# Patient Record
Sex: Female | Born: 2000
Health system: Southern US, Community
[De-identification: ages and names within clinical notes are randomized; demographics above are authoritative.]

## PROBLEM LIST (undated history)

## (undated) DIAGNOSIS — R9431 Abnormal electrocardiogram [ECG] [EKG]: Secondary | ICD-10-CM

## (undated) DIAGNOSIS — N920 Excessive and frequent menstruation with regular cycle: Secondary | ICD-10-CM

## (undated) DIAGNOSIS — I493 Ventricular premature depolarization: Secondary | ICD-10-CM

## (undated) DIAGNOSIS — H547 Unspecified visual loss: Secondary | ICD-10-CM

## (undated) HISTORY — DX: Excessive and frequent menstruation with regular cycle: N92.0

## (undated) HISTORY — DX: Abnormal electrocardiogram (ECG) (EKG): R94.31

## (undated) HISTORY — DX: Ventricular premature depolarization: I49.3

## (undated) HISTORY — DX: Unspecified visual loss: H54.7

---
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
## 2021-01-01 DIAGNOSIS — Z23 Encounter for immunization: Secondary | ICD-10-CM | POA: Diagnosis not present

## 2021-08-09 ENCOUNTER — Emergency Department
Admission: EM | Admit: 2021-08-09 | Discharge: 2021-08-09 | Disposition: A | Discharge status: Home / Self Care | Payer: BC Managed Care – PPO | Attending: Emergency Medicine | Admitting: Emergency Medicine

## 2021-08-09 ENCOUNTER — Emergency Department: Payer: BC Managed Care – PPO

## 2021-08-09 ENCOUNTER — Other Ambulatory Visit: Payer: Self-pay

## 2021-08-09 ENCOUNTER — Ambulatory Visit
Admission: EM | Admit: 2021-08-09 | Discharge: 2021-08-09 | Disposition: A | Discharge status: Home / Self Care | Payer: BC Managed Care – PPO | Attending: Emergency Medicine | Admitting: Emergency Medicine

## 2021-08-09 DIAGNOSIS — R112 Nausea with vomiting, unspecified: Secondary | ICD-10-CM | POA: Diagnosis not present

## 2021-08-09 DIAGNOSIS — R1010 Upper abdominal pain, unspecified: Secondary | ICD-10-CM | POA: Diagnosis not present

## 2021-08-09 DIAGNOSIS — R1084 Generalized abdominal pain: Secondary | ICD-10-CM | POA: Diagnosis not present

## 2021-08-09 DIAGNOSIS — R1013 Epigastric pain: Secondary | ICD-10-CM | POA: Diagnosis not present

## 2021-08-09 LAB — CBC
HCT: 39.1 % (ref 36.0–46.0)
Hemoglobin: 12.5 g/dL (ref 12.0–15.0)
MCH: 23.9 pg — ABNORMAL LOW (ref 26.0–34.0)
MCHC: 32 g/dL (ref 30.0–36.0)
MCV: 74.6 fL — ABNORMAL LOW (ref 80.0–100.0)
Platelets: 298 10*3/uL (ref 150–400)
RBC: 5.24 MIL/uL — ABNORMAL HIGH (ref 3.87–5.11)
RDW: 14.2 % (ref 11.5–15.5)
WBC: 7.3 10*3/uL (ref 4.0–10.5)
nRBC: 0 % (ref 0.0–0.2)

## 2021-08-09 LAB — COMPREHENSIVE METABOLIC PANEL
ALT: 15 U/L (ref 0–44)
AST: 17 U/L (ref 15–41)
Albumin: 4.5 g/dL (ref 3.5–5.0)
Alkaline Phosphatase: 62 U/L (ref 38–126)
Anion gap: 7 (ref 5–15)
BUN: 10 mg/dL (ref 6–20)
CO2: 26 mmol/L (ref 22–32)
Calcium: 9.8 mg/dL (ref 8.9–10.3)
Chloride: 105 mmol/L (ref 98–111)
Creatinine, Ser: 0.67 mg/dL (ref 0.44–1.00)
GFR, Estimated: >60 mL/min (ref >60)
Glucose, Bld: 104 mg/dL — ABNORMAL HIGH (ref 70–99)
Potassium: 3.4 mmol/L — ABNORMAL LOW (ref 3.5–5.1)
Sodium: 138 mmol/L (ref 135–145)
Total Bilirubin: 0.5 mg/dL (ref 0.3–1.2)
Total Protein: 8.1 g/dL (ref 6.5–8.1)

## 2021-08-09 LAB — POCT URINALYSIS DIP (MANUAL ENTRY)
Bilirubin, UA: NEGATIVE
Blood, UA: NEGATIVE
Glucose, UA: NEGATIVE mg/dL
Ketones, POC UA: NEGATIVE mg/dL
Leukocytes, UA: NEGATIVE
Nitrite, UA: NEGATIVE
Protein Ur, POC: NEGATIVE mg/dL
Spec Grav, UA: 1.015 (ref 1.010–1.025)
Urobilinogen, UA: 0.2 E.U./dL
pH, UA: 6 (ref 5.0–8.0)

## 2021-08-09 LAB — LIPASE, BLOOD: Lipase: 23 U/L (ref 11–51)

## 2021-08-09 LAB — POCT URINE PREGNANCY: Preg Test, Ur: NEGATIVE

## 2021-08-09 MED ORDER — ONDANSETRON 4 MG PO TBDP: 4.0000 mg | ORAL_TABLET | Freq: Three times a day (TID) | ORAL | 0 refills | Status: DC | PRN

## 2021-08-09 MED ORDER — SUCRALFATE 1 G PO TABS: 1.0000 g | ORAL_TABLET | Freq: Three times a day (TID) | ORAL | 0 refills | Status: AC

## 2021-08-09 MED ORDER — ONDANSETRON 4 MG PO TBDP
4.0000 mg | ORAL_TABLET | Freq: Once | ORAL | Status: AC | PRN
  Administered 2021-08-09: 4 mg via ORAL
  Filled 2021-08-09: qty 1

## 2021-08-09 MED ORDER — LIDOCAINE VISCOUS HCL 2 % MT SOLN
15.0000 mL | Freq: Once | OROMUCOSAL | Status: AC
  Administered 2021-08-09: 15 mL via ORAL
  Filled 2021-08-09: qty 15

## 2021-08-09 MED ORDER — PANTOPRAZOLE SODIUM 40 MG PO TBEC
40.0000 mg | DELAYED_RELEASE_TABLET | Freq: Once | ORAL | Status: AC
  Administered 2021-08-09: 40 mg via ORAL
  Filled 2021-08-09: qty 1

## 2021-08-09 MED ORDER — ONDANSETRON 4 MG PO TBDP
4.0000 mg | ORAL_TABLET | Freq: Once | ORAL | Status: AC
  Administered 2021-08-09: 4 mg via ORAL
  Filled 2021-08-09: qty 1

## 2021-08-09 MED ORDER — ONDANSETRON 4 MG PO TBDP: 4.0000 mg | ORAL_TABLET | Freq: Three times a day (TID) | ORAL | 0 refills | Status: AC | PRN

## 2021-08-09 MED ORDER — OMEPRAZOLE 20 MG PO CPDR: 20.0000 mg | DELAYED_RELEASE_CAPSULE | Freq: Every day | ORAL | 0 refills | Status: AC

## 2021-08-09 MED ORDER — ALUM & MAG HYDROXIDE-SIMETH 200-200-20 MG/5ML PO SUSP
30.0000 mL | Freq: Once | ORAL | Status: AC
  Administered 2021-08-09: 30 mL via ORAL
  Filled 2021-08-09: qty 30

## 2021-08-09 NOTE — ED Provider Notes (Signed)
[facility] Provider Note    Event Date/Time   First MD Initiated Contact with Patient 08/09/21 2126     (approximate)   History   Abdominal Pain   HPI  Yolanda Watkins is a 21 y.o. female here with abdominal pain.  The patient states that for the last several days, she has had fairly consistent, epigastric, abdominal pain.  She had associated nausea and intermittent vomiting.  She states that the pain is actually better when she eats, she was she has been eating almost nonstop.  She does intermittently vomit but has been able to hold down most meals.  Pain is aching, gnawing.  She states she has a family history of ulcers due to eating "a lot of spicy foods."  She has never had an ulcer herself.  No changes in her bowel habits.  No hematemesis.  No fevers or chills.  No recent travel.     Physical Exam   Triage Vital Signs: ED Triage Vitals  Enc Vitals Group     BP 08/09/21 1718 130/71     Pulse Rate 08/09/21 1718 64     Resp 08/09/21 1718 16     Temp 08/09/21 1718 98.4 F (36.9 C)     Temp Source 08/09/21 1718 Oral     SpO2 08/09/21 1718 100 %     Weight --      Height 08/09/21 1727 5\' 7"  (1.702 m)     Head Circumference --      Peak Flow --      Pain Score 08/09/21 1727 6     Pain Loc --      Pain Edu? --      Excl. in GC? --     Most recent vital signs: Vitals:   08/09/21 2138 08/09/21 2317  BP: 126/84 129/72  Pulse: 73 66  Resp: 18 18  Temp: 98.4 F (36.9 C)   SpO2: 100% 98%     General: Awake, no distress.  CV:  Good peripheral perfusion.  Resp:  Normal effort.  Abd:  No distention.  Minimal epigastric tenderness.  No right upper quadrant tenderness.  Negative Murphy's. Other:  Moist mucous membranes.   ED Results / Procedures / Treatments   Labs (all labs ordered are listed, but only abnormal results are displayed) Labs Reviewed  COMPREHENSIVE METABOLIC PANEL - Abnormal; Notable for the following components:      Result  Value   Potassium 3.4 (*)    Glucose, Bld 104 (*)    All other components within normal limits  CBC - Abnormal; Notable for the following components:   RBC 5.24 (*)    MCV 74.6 (*)    MCH 23.9 (*)    All other components within normal limits  LIPASE, BLOOD  URINALYSIS, ROUTINE W REFLEX MICROSCOPIC  POC URINE PREG, ED     EKG    RADIOLOGY Ultrasound: Common bile duct at the upper limit of normal, subtle increased echogenicity of the portal triads   I also independently reviewed and agree with radiologist interpretations.   PROCEDURES:  Critical Care performed: No     MEDICATIONS ORDERED IN ED: Medications  ondansetron (ZOFRAN-ODT) disintegrating tablet 4 mg (4 mg Oral Given 08/09/21 1730)  ondansetron (ZOFRAN-ODT) disintegrating tablet 4 mg (4 mg Oral Given 08/09/21 2214)  alum & mag hydroxide-simeth (MAALOX/MYLANTA) 200-200-20 MG/5ML suspension 30 mL (30 mLs Oral Given 08/09/21 2214)    And  lidocaine (XYLOCAINE) 2 % viscous mouth  solution 15 mL (15 mLs Oral Given 08/09/21 2214)  pantoprazole (PROTONIX) EC tablet 40 mg (40 mg Oral Given 08/09/21 2214)     IMPRESSION / MDM / ASSESSMENT AND PLAN / ED COURSE  I reviewed the triage vital signs and the nursing notes.                               The patient is on the cardiac monitor to evaluate for evidence of arrhythmia and/or significant heart rate changes.   Ddx:  Differential includes the following, with pertinent life- or limb-threatening emergencies considered:  Peptic ulcer disease with consideration of duodenal ulcer, peptic ulcer, duodenitis, less likely cholecystitis, hepatitis, pancreatitis  Patient's presentation is most consistent with acute illness / injury with system symptoms.  MDM:  21 year old well-appearing female here with intermittent abdominal pain, which actually improved with eating.  On exam, she is overall well-appearing and in no distress.  Lab work shows no leukocytosis or anemia.  CMP is  very reassuring with normal renal function, LFTs, and normal lipase.  Bilirubin is normal.  Ultrasound obtained, shows common bile duct at the upper limit of normal.  Also slightly increased echogenicity of the portal triads.  However, LFTs are completely normal, she has no right upper quadrant tenderness, exam is not concerning for cholangitis, choledocholithiasis, cholecystitis, or hepatitis.  Patient feels better with antacid treatment.  Treat for possible duodenal ulcers, no evidence of active bleeding.  Will advise outpatient follow-up and possible GI follow-up.  Avoid NSAIDs.   MEDICATIONS GIVEN IN ED: Medications  ondansetron (ZOFRAN-ODT) disintegrating tablet 4 mg (4 mg Oral Given 08/09/21 1730)  ondansetron (ZOFRAN-ODT) disintegrating tablet 4 mg (4 mg Oral Given 08/09/21 2214)  alum & mag hydroxide-simeth (MAALOX/MYLANTA) 200-200-20 MG/5ML suspension 30 mL (30 mLs Oral Given 08/09/21 2214)    And  lidocaine (XYLOCAINE) 2 % viscous mouth solution 15 mL (15 mLs Oral Given 08/09/21 2214)  pantoprazole (PROTONIX) EC tablet 40 mg (40 mg Oral Given 08/09/21 2214)     Consults:     EMR reviewed  Previous urgent care visits, PCP visits     FINAL CLINICAL IMPRESSION(S) / ED DIAGNOSES   Final diagnoses:  Generalized abdominal pain     Rx / DC Orders   ED Discharge Orders          Ordered    omeprazole (PRILOSEC) 20 MG capsule  Daily        08/09/21 2302    ondansetron (ZOFRAN-ODT) 4 MG disintegrating tablet  Every 8 hours PRN        08/09/21 2302    sucralfate (CARAFATE) 1 g tablet  3 times daily with meals & bedtime        08/09/21 2302             Note:  This document was prepared using Dragon voice recognition software and may include unintentional dictation errors.   Shaune Pollack, MD 08/09/21 [phone]

## 2021-08-09 NOTE — ED Provider Triage Note (Signed)
Emergency Medicine Provider Triage Evaluation Note  Yolanda Watkins , a 21 y.o. female  was evaluated in triage.  Pt complains of upper abdominal pain with associated vomiting for the last 5 days. She denies bowel changes or fevers. She was evaluated at [facility] and referred to the ED.  Review of Systems  Positive: Epigastric pain, NV Negative: FCS  Physical Exam  BP 130/71 (BP Location: Left Arm)   Pulse 64   Temp 98.4 F (36.9 C) (Oral)   Resp 16   Ht 5\' 7"  (1.702 m)   LMP 08/02/2021   SpO2 100%   BMI 27.41 kg/m  Gen:   Awake, no distress  NAD Resp:  Normal effort CTA MSK:   Moves extremities without difficulty  ABD:  Soft, nontender. No CVA tenderness  Medical Decision Making  Medically screening exam initiated at 5:30 PM.  Appropriate orders placed.  Yolanda Watkins was informed that the remainder of the evaluation will be completed by another provider, this initial triage assessment does not replace that evaluation, and the importance of remaining in the ED until their evaluation is complete.  Patient to the ED for evaluation of epigastric domino pain with associated nausea vomiting for the last 5 days.  Patient presents in no acute distress from local urgent care.   Yolanda Lex, PA-C 08/09/21 1739

## 2021-08-09 NOTE — ED Provider Notes (Signed)
Renaldo Fiddler    CSN: [id number] Arrival date & time: 08/09/21  1523      History   Chief Complaint Chief Complaint  Patient presents with   Abdominal Pain   Emesis   Gastroesophageal Reflux    HPI Yolanda Watkins is a 21 y.o. female.  Accompanied by her father, patient presents with upper abdominal pain, nausea, and vomiting x5 days.  The pain improves after she eats but then returns.  No known aggravating factors.  1 episode of emesis early this morning; She has been able to eat and drink since then today without vomiting.  Last bowel movement yesterday no fever, chills, diarrhea, constipation, blood in stool, dysuria, hematuria, vaginal discharge, pelvic pain, or other symptoms.  LMP: 08/02/2021.    The history is provided by the patient and medical records.    Past Medical History:  Diagnosis Date   Abnormal EKG    Decreased vision    Menorrhagia    PVC (premature ventricular contraction)     Patient Active Problem List   Diagnosis Date Noted   Heart murmur 02/18/2020   PVC's (premature ventricular contractions) 12/24/2019   Right otitis media 04/05/2013    History reviewed. No pertinent surgical history.  OB History   No obstetric history on file.      Home Medications    Prior to Admission medications   Medication Sig Start Date End Date Taking? Authorizing Provider  ondansetron (ZOFRAN-ODT) 4 MG disintegrating tablet Take 1 tablet (4 mg total) by mouth every 8 (eight) hours as needed for nausea or vomiting. 08/09/21  Yes Mickie Bail, NP    Family History Family History  Problem Relation Age of Onset   Healthy Mother    Healthy Father     Social History Social History   Tobacco Use   Smoking status: Never   Smokeless tobacco: Never     Allergies   Patient has no known allergies.   Review of Systems Review of Systems  Constitutional:  Negative for chills and fever.  Respiratory:  Negative for cough and shortness of breath.    Cardiovascular:  Negative for chest pain and palpitations.  Gastrointestinal:  Positive for abdominal pain, nausea and vomiting. Negative for blood in stool, constipation and diarrhea.  Genitourinary:  Negative for dysuria, flank pain, hematuria, pelvic pain and vaginal discharge.  Skin:  Negative for color change and rash.  All other systems reviewed and are negative.    Physical Exam Triage Vital Signs ED Triage Vitals [08/09/21 1534]  Enc Vitals Group     BP 123/72     Pulse Rate 63     Resp 18     Temp 97.7 F (36.5 C)     Temp src      SpO2 98 %     Weight      Height      Head Circumference      Peak Flow      Pain Score      Pain Loc      Pain Edu?      Excl. in GC?    No data found.  Updated Vital Signs BP 123/72   Pulse 63   Temp 97.7 F (36.5 C)   Resp 18   LMP 08/02/2021   SpO2 98%   Visual Acuity Right Eye Distance:   Left Eye Distance:   Bilateral Distance:    Right Eye Near:   Left Eye Near:  Bilateral Near:     Physical Exam Vitals and nursing note reviewed.  Constitutional:      General: She is not in acute distress.    Appearance: Normal appearance. She is well-developed. She is not ill-appearing.  HENT:     Mouth/Throat:     Mouth: Mucous membranes are moist.  Eyes:     Conjunctiva/sclera: Conjunctivae normal.  Cardiovascular:     Rate and Rhythm: Normal rate and regular rhythm.     Heart sounds: Normal heart sounds.  Pulmonary:     Effort: Pulmonary effort is normal. No respiratory distress.     Breath sounds: Normal breath sounds.  Abdominal:     General: Bowel sounds are normal.     Palpations: Abdomen is soft.     Tenderness: There is no abdominal tenderness. There is no right CVA tenderness, left CVA tenderness, guarding or rebound.  Musculoskeletal:     Cervical back: Neck supple.  Skin:    General: Skin is warm and dry.  Neurological:     Mental Status: She is alert.  Psychiatric:        Mood and Affect: Mood  normal.        Behavior: Behavior normal.      UC Treatments / Results  Labs (all labs ordered are listed, but only abnormal results are displayed) Labs Reviewed  POCT URINALYSIS DIP (MANUAL ENTRY)  POCT URINE PREGNANCY    EKG   Radiology No results found.  Procedures Procedures (including critical care time)  Medications Ordered in UC Medications - No data to display  Initial Impression / Assessment and Plan / UC Course  I have reviewed the triage vital signs and the nursing notes.  Pertinent labs & imaging results that were available during my care of the patient were reviewed by me and considered in my medical decision making (see chart for details).  Upper abdominal pain, nausea, vomiting.  Afebrile, vital signs are stable.  Abdomen is soft and nontender with good bowel sounds.  Urine is normal and urine pregnancy negative.  Treating nausea and vomiting with Zofran.  Instructed patient to keep yourself hydrated with clear liquids such as water.  She has been able to eat and drink today without vomiting.  Education provided on abdominal pain and on nausea and vomiting.  ED precautions discussed for abdominal pain or other concerning symptoms.  Instructed patient to follow-up with her PCP.  She agrees to plan of care.   Final Clinical Impressions(s) / UC Diagnoses   Final diagnoses:  Pain of upper abdomen  Nausea and vomiting, unspecified vomiting type     Discharge Instructions      Take the antinausea medication as directed.    Keep yourself hydrated with clear liquids, such as water and Gatorade.    Go to the emergency department if you have acute abdominal pain or other concerning symptoms.    Follow up with your primary care provider if your symptoms are not improving.          ED Prescriptions     Medication Sig Dispense Auth. Provider   ondansetron (ZOFRAN-ODT) 4 MG disintegrating tablet Take 1 tablet (4 mg total) by mouth every 8 (eight) hours  as needed for nausea or vomiting. 20 tablet Mickie Bail, NP      PDMP not reviewed this encounter.   Mickie Bail, NP 08/09/21 [phone]

## 2021-08-09 NOTE — ED Triage Notes (Signed)
Patient presents to Urgent Care with complaints of epigastric pain, indigestion and vomiting since 5 days ago. One episode of vomiting per day and pt feels like something is stuck in her throat. Treating symptoms with Tums.   Denies fever.

## 2021-08-09 NOTE — ED Triage Notes (Signed)
Patient to ER via POV from urgent care. Reports epigastric pain and vomiting that started 5 days ago. Denies any sick contacts. Denies urinary symptoms. No relief from tums.

## 2021-08-09 NOTE — Discharge Instructions (Addendum)
Take the antinausea medication as directed.    Keep yourself hydrated with clear liquids, such as water and Gatorade.    Go to the emergency department if you have acute abdominal pain or other concerning symptoms.    Follow up with your primary care provider if your symptoms are not improving.

## 2022-07-08 IMAGING — US US ABDOMEN LIMITED
1 series · 14 of 25 positions shown · non-contrast
Comparison: None Available.

CLINICAL DATA: Six days of epigastric pain.

EXAM:
ULTRASOUND ABDOMEN LIMITED RIGHT UPPER QUADRANT

[Series 1: us abdomen limited ruq (liver/gb) · 14 of 33 slices shown]
[im 1/33]
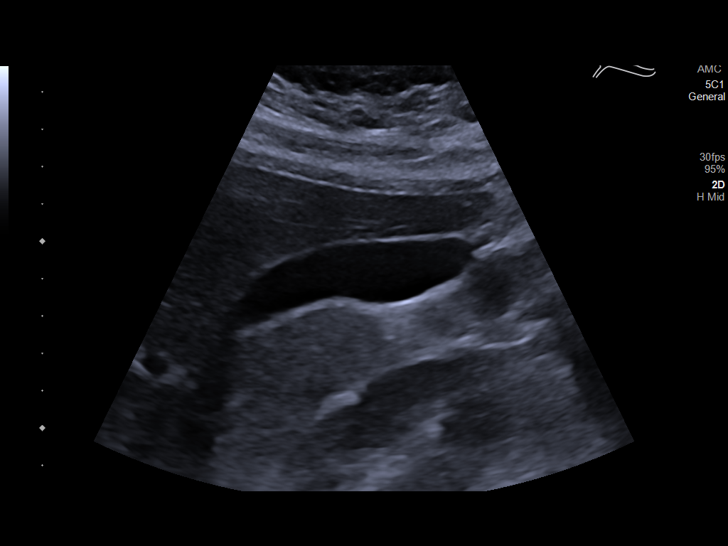
[im 3/33]
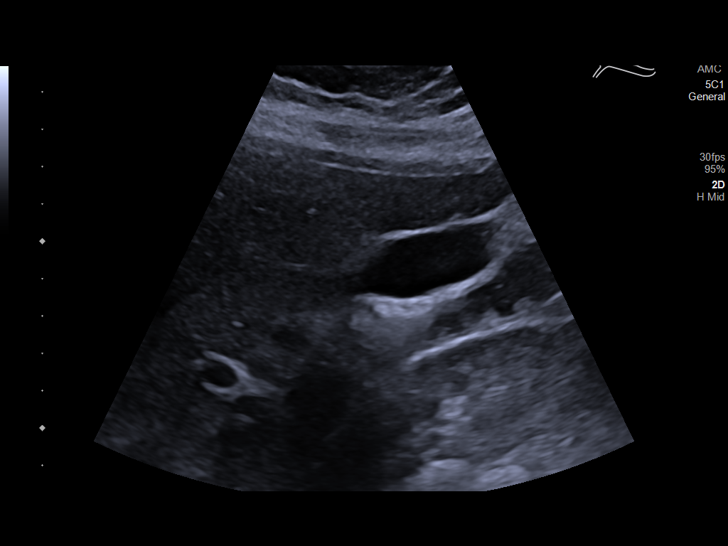
[im 6/33]
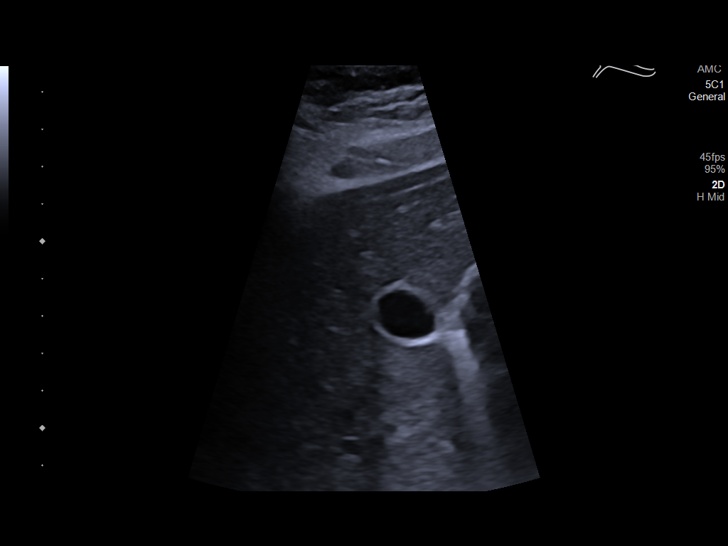
[im 9/33]
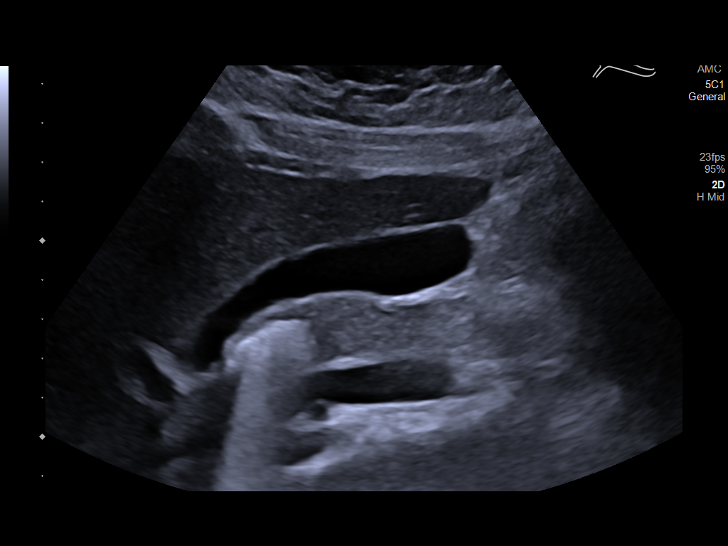
[im 11/33]
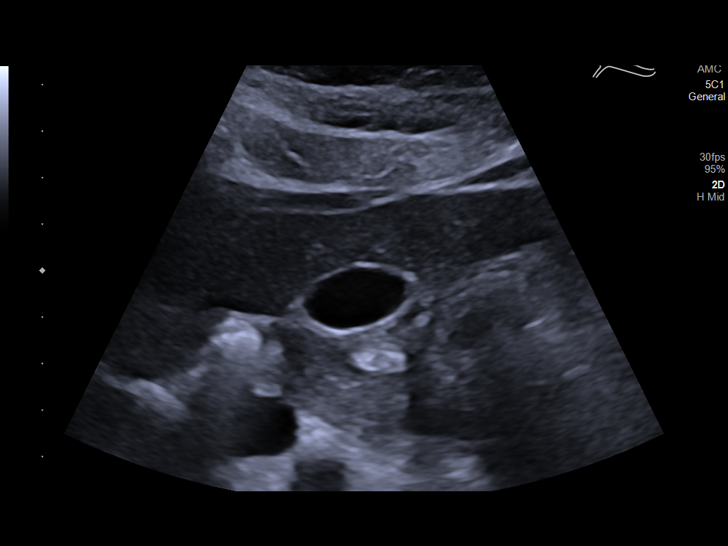
[im 13/33]
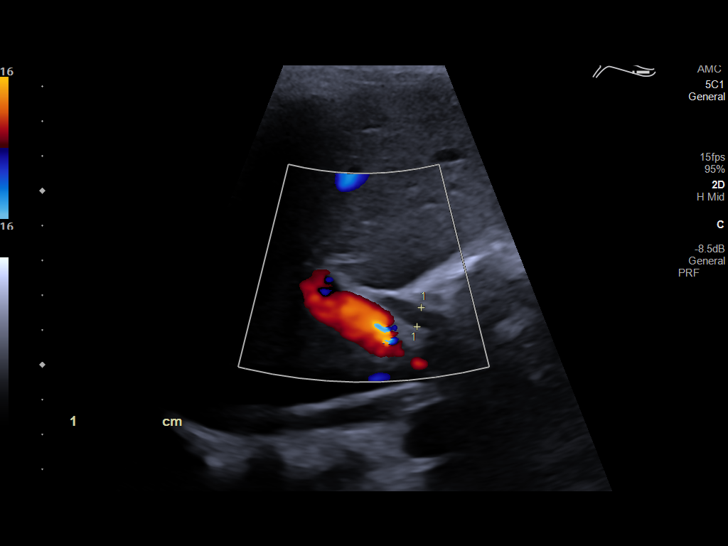
[im 15/33]
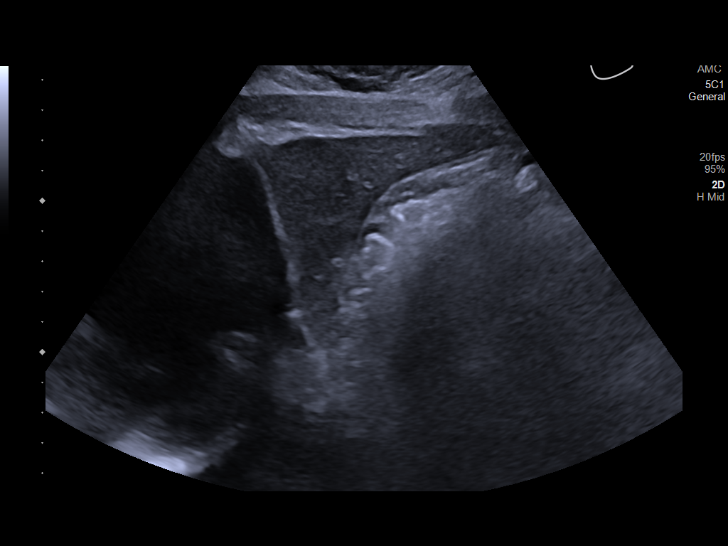
[im 18/33]
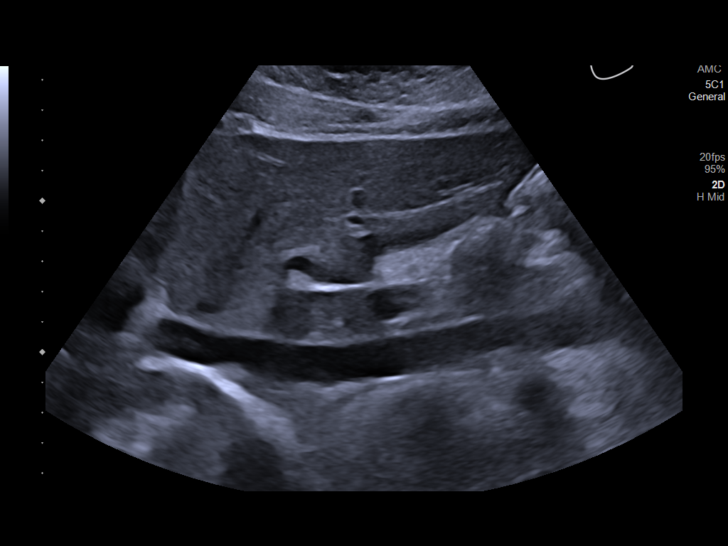
[im 21/33]
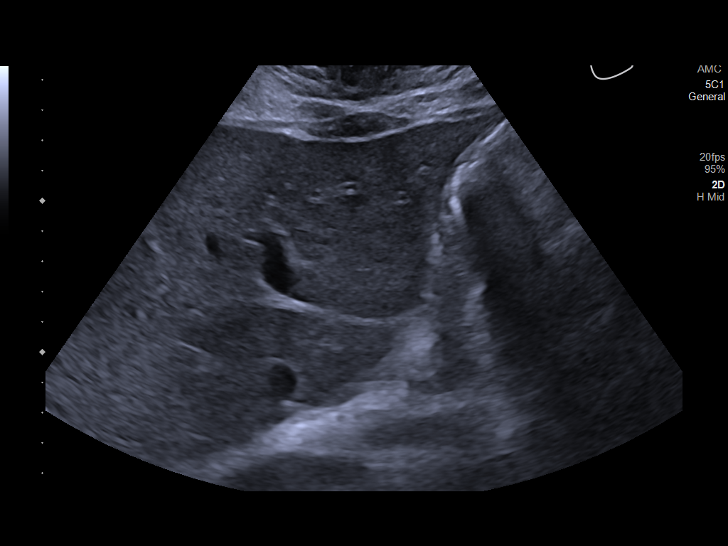
[im 22/33]
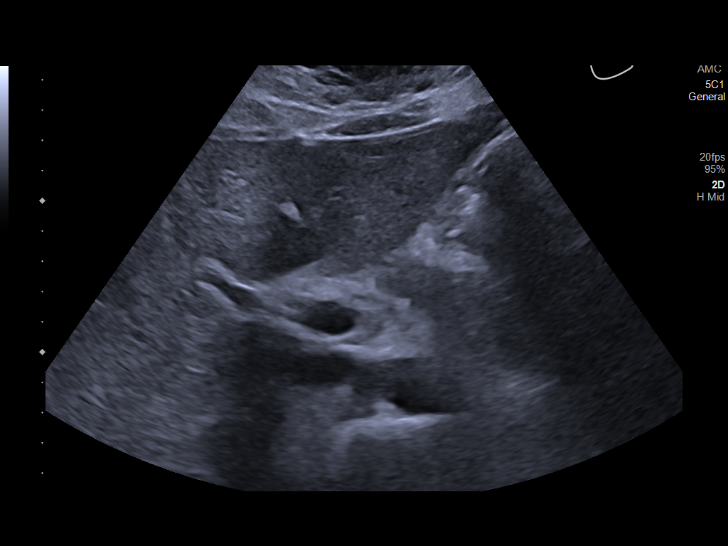
[im 25/33]
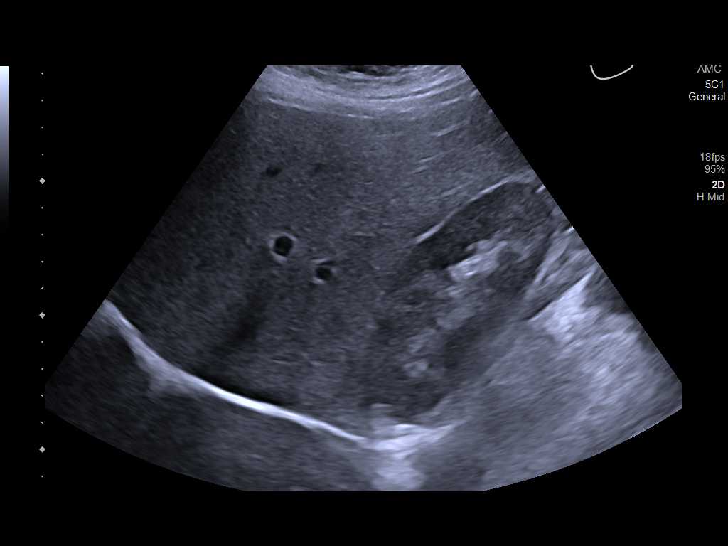
[im 27/33]
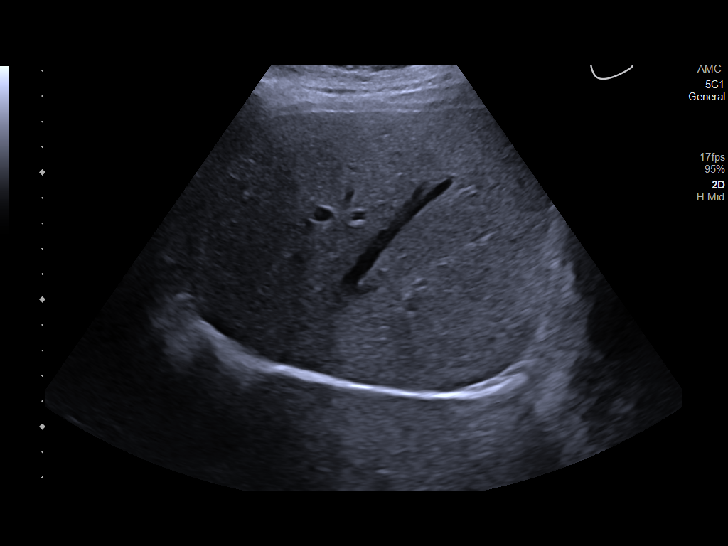
[im 30/33]
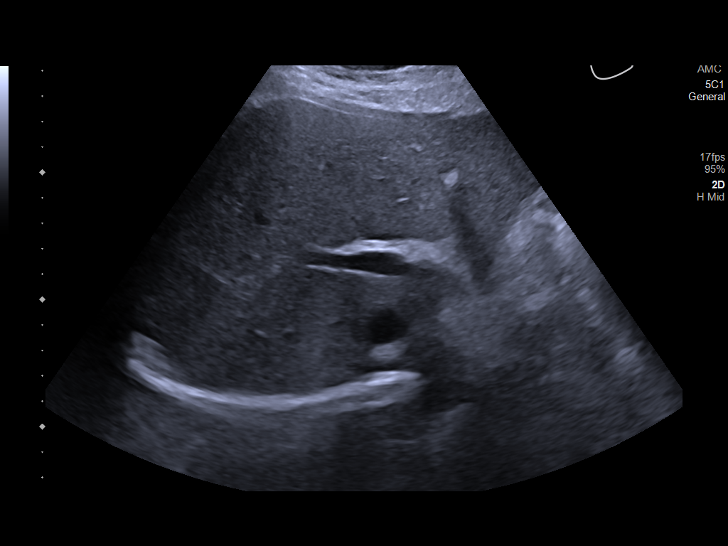
[im 33/33]
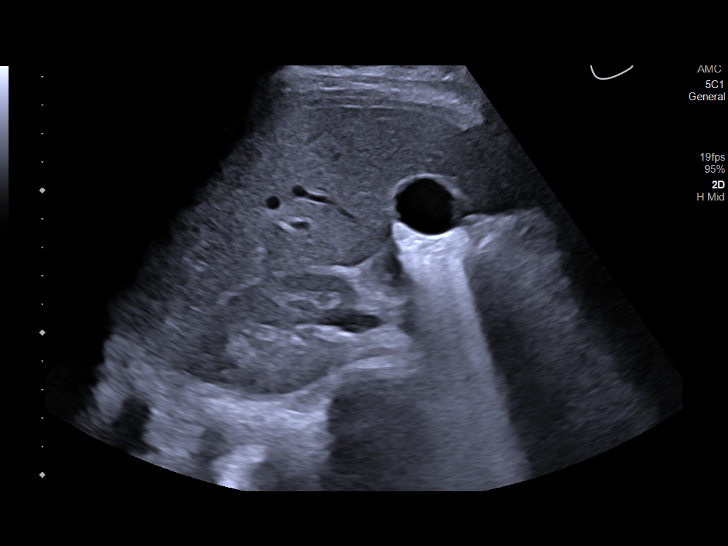

[14 of 25 positions shown; findings below may reference images not displayed]

FINDINGS: Gallbladder:

No gallstones or wall thickening visualized. No sonographic Murphy
sign noted by sonographer.

Common bile duct:

Diameter: 6 mm

Liver:

No focal lesion identified. Slight increased echogenicity along the
portal triads. Portal vein is patent on color Doppler imaging with
normal direction of blood flow towards the liver.

Other: None.
IMPRESSION: Common bile duct measures upper limits of normal at 6 mm recommend
correlation with serum bilirubin to exclude biliary obstruction.

Subtly increased echogenicity of the portal triads, nonspecific but
may reflect hepatitis, recommend correlation with AST/ALT.

Gallbladder appears within normal limits without cholelithiasis.

## 2022-10-18 DIAGNOSIS — Z124 Encounter for screening for malignant neoplasm of cervix: Secondary | ICD-10-CM | POA: Diagnosis not present

## 2022-10-18 DIAGNOSIS — Z0000 Encounter for general adult medical examination without abnormal findings: Secondary | ICD-10-CM | POA: Diagnosis not present

## 2022-10-18 DIAGNOSIS — Z6832 Body mass index (BMI) 32.0-32.9, adult: Secondary | ICD-10-CM | POA: Diagnosis not present

## 2022-10-18 DIAGNOSIS — Z23 Encounter for immunization: Secondary | ICD-10-CM | POA: Diagnosis not present

## 2022-11-21 DIAGNOSIS — Z01419 Encounter for gynecological examination (general) (routine) without abnormal findings: Secondary | ICD-10-CM | POA: Diagnosis not present
